# Patient Record
Sex: Female | Born: 1955 | Race: White | Hispanic: No | Marital: Married | Smoking: Former smoker
Health system: Southern US, Community
[De-identification: ages and names within clinical notes are randomized; demographics above are authoritative.]

## PROBLEM LIST (undated history)

## (undated) DIAGNOSIS — E063 Autoimmune thyroiditis: Secondary | ICD-10-CM

## (undated) DIAGNOSIS — K219 Gastro-esophageal reflux disease without esophagitis: Secondary | ICD-10-CM

## (undated) HISTORY — PX: ABDOMINAL HYSTERECTOMY: SHX81

---

## 2017-11-11 DIAGNOSIS — F4320 Adjustment disorder, unspecified: Secondary | ICD-10-CM | POA: Diagnosis not present

## 2017-12-23 DIAGNOSIS — Z0000 Encounter for general adult medical examination without abnormal findings: Secondary | ICD-10-CM | POA: Diagnosis not present

## 2017-12-28 DIAGNOSIS — Z0000 Encounter for general adult medical examination without abnormal findings: Secondary | ICD-10-CM | POA: Diagnosis not present

## 2018-03-17 DIAGNOSIS — F4320 Adjustment disorder, unspecified: Secondary | ICD-10-CM | POA: Diagnosis not present

## 2018-06-13 DIAGNOSIS — F4320 Adjustment disorder, unspecified: Secondary | ICD-10-CM | POA: Diagnosis not present

## 2018-07-28 DIAGNOSIS — Z6837 Body mass index (BMI) 37.0-37.9, adult: Secondary | ICD-10-CM | POA: Diagnosis not present

## 2018-07-28 DIAGNOSIS — E063 Autoimmune thyroiditis: Secondary | ICD-10-CM | POA: Diagnosis not present

## 2018-07-28 DIAGNOSIS — Z1389 Encounter for screening for other disorder: Secondary | ICD-10-CM | POA: Diagnosis not present

## 2018-12-07 DIAGNOSIS — E063 Autoimmune thyroiditis: Secondary | ICD-10-CM | POA: Diagnosis not present

## 2019-03-08 DIAGNOSIS — Z0000 Encounter for general adult medical examination without abnormal findings: Secondary | ICD-10-CM | POA: Diagnosis not present

## 2019-03-08 DIAGNOSIS — Z6834 Body mass index (BMI) 34.0-34.9, adult: Secondary | ICD-10-CM | POA: Diagnosis not present

## 2019-03-08 DIAGNOSIS — Z23 Encounter for immunization: Secondary | ICD-10-CM | POA: Diagnosis not present

## 2019-03-08 DIAGNOSIS — E063 Autoimmune thyroiditis: Secondary | ICD-10-CM | POA: Diagnosis not present

## 2019-03-08 DIAGNOSIS — Z1389 Encounter for screening for other disorder: Secondary | ICD-10-CM | POA: Diagnosis not present

## 2019-03-08 DIAGNOSIS — E6609 Other obesity due to excess calories: Secondary | ICD-10-CM | POA: Diagnosis not present

## 2019-04-10 DIAGNOSIS — E063 Autoimmune thyroiditis: Secondary | ICD-10-CM | POA: Diagnosis not present

## 2019-08-09 DIAGNOSIS — F4323 Adjustment disorder with mixed anxiety and depressed mood: Secondary | ICD-10-CM | POA: Diagnosis not present

## 2019-08-14 DIAGNOSIS — F4323 Adjustment disorder with mixed anxiety and depressed mood: Secondary | ICD-10-CM | POA: Diagnosis not present

## 2019-08-28 DIAGNOSIS — F4323 Adjustment disorder with mixed anxiety and depressed mood: Secondary | ICD-10-CM | POA: Diagnosis not present

## 2019-09-03 DIAGNOSIS — E6609 Other obesity due to excess calories: Secondary | ICD-10-CM | POA: Diagnosis not present

## 2019-09-03 DIAGNOSIS — Z6834 Body mass index (BMI) 34.0-34.9, adult: Secondary | ICD-10-CM | POA: Diagnosis not present

## 2019-09-03 DIAGNOSIS — E039 Hypothyroidism, unspecified: Secondary | ICD-10-CM | POA: Diagnosis not present

## 2019-09-04 DIAGNOSIS — F4323 Adjustment disorder with mixed anxiety and depressed mood: Secondary | ICD-10-CM | POA: Diagnosis not present

## 2019-09-25 DIAGNOSIS — F4323 Adjustment disorder with mixed anxiety and depressed mood: Secondary | ICD-10-CM | POA: Diagnosis not present

## 2019-11-19 DIAGNOSIS — Z1231 Encounter for screening mammogram for malignant neoplasm of breast: Secondary | ICD-10-CM | POA: Diagnosis not present

## 2021-02-26 ENCOUNTER — Ambulatory Visit
Admission: EM | Admit: 2021-02-26 | Discharge: 2021-02-26 | Disposition: A | Discharge status: Home / Self Care | Payer: 59 | Attending: Emergency Medicine | Admitting: Emergency Medicine

## 2021-02-26 ENCOUNTER — Encounter: Payer: Self-pay | Admitting: Emergency Medicine

## 2021-02-26 DIAGNOSIS — S61211A Laceration without foreign body of left index finger without damage to nail, initial encounter: Secondary | ICD-10-CM

## 2021-02-26 DIAGNOSIS — Z23 Encounter for immunization: Secondary | ICD-10-CM

## 2021-02-26 HISTORY — DX: Gastro-esophageal reflux disease without esophagitis: K21.9

## 2021-02-26 HISTORY — DX: Autoimmune thyroiditis: E06.3

## 2021-02-26 MED ORDER — TETANUS-DIPHTH-ACELL PERTUSSIS 5-2.5-18.5 LF-MCG/0.5 IM SUSY
0.5000 mL | PREFILLED_SYRINGE | Freq: Once | INTRAMUSCULAR | Status: AC
  Administered 2021-02-26: 0.5 mL via INTRAMUSCULAR

## 2021-02-26 NOTE — Discharge Instructions (Addendum)
Tetanus updated Bandage applied Keep covered for next and dry for next 24-48 hours.  After then you may gently clean with warm water and mild soap.  Avoid submerging wound in water. Change dressing daily and apply a thin layer of neosporin.  Return in 10 days to have sutures removed.   Take OTC ibuprofen or tylenol as needed for pain relief Return sooner or go to the ED if you have any new or worsening symptoms such as increased pain, redness, swelling, drainage, discharge, decreased range of motion of extremity, etc..

## 2021-02-26 NOTE — ED Provider Notes (Signed)
[facility]   [id number] 02/26/21 Arrival Time: 0839  CC: LACERATION  SUBJECTIVE:  Kathleen Bray is a 65 y.o. female who presents with a laceration to LT index finger that occurred this morning.  Symptoms began after cutting chicken with serrated knife.  Bleeding controlled.  Currently not on blood thinners.  Denies similar symptoms in the past.  Denies fever, chills, nausea, vomiting, redness, swelling, purulent drainage, decrease strength or sensation.   Td UTD: No.  ROS: As per HPI.  All other pertinent ROS negative.     Past Medical History:  Diagnosis Date   GERD (gastroesophageal reflux disease)    Hashimoto's disease    Past Surgical History:  Procedure Laterality Date   ABDOMINAL HYSTERECTOMY     No Known Allergies No current facility-administered medications on file prior to encounter.   No current outpatient medications on file prior to encounter.   Social History   Socioeconomic History   Marital status: Single    Spouse name: Not on file   Number of children: Not on file   Years of education: Not on file   Highest education level: Not on file  Occupational History   Not on file  Tobacco Use   Smoking status: Former    Pack years: 0.00    Types: Cigarettes    Passive exposure: Never   Smokeless tobacco: Never  Substance and Sexual Activity   Alcohol use: Yes    Alcohol/week: 1.0 standard drink    Types: 1 Glasses of wine per week    Comment: evenings   Drug use: Never   Sexual activity: Never  Other Topics Concern   Not on file  Social History Narrative   Not on file   Social Determinants of Health   Financial Resource Strain: Not on file  Food Insecurity: Not on file  Transportation Needs: Not on file  Physical Activity: Not on file  Stress: Not on file  Social Connections: Not on file  Intimate Partner Violence: Not on file   No family history on file.   OBJECTIVE:  Vitals:   02/26/21 0845 02/26/21 0846  BP: (!) 143/85    Pulse: 79   Resp: 19   Temp: 99 F (37.2 C)   TempSrc: Oral   SpO2: 94%   Weight:  218 lb (98.9 kg)  Height:  5\' 6"  (1.676 m)     General appearance: alert; no distress CV: radial pulse 2+ Finger: Strength and sensation intact Skin: laceration of second digit over PIP joint palmar aspect; size: approx 1 cm Psychological: alert and cooperative; normal mood and affect   Procedure: Verbal consent obtained. Patient provided with risks and alternatives to the procedure. Wound copiously irrigated with NS then cleansed with betadine. Anesthetized with 3 mL of lidocaine without epinephrine Wound carefully explored. No foreign body, tendon injury, or nonviable tissue were noted. Using sterile technique 1 horizontal 5-0 Prolene sutures were placed to reapproximate the wound. Patient tolerated procedure well. No complications. Minimal bleeding. Patient advised to look for and return for any signs of infection such as redness, swelling, discharge, or worsening pain. Return for suture removal in 10-12 days.  ASSESSMENT & PLAN:  1. Laceration of left index finger without foreign body without damage to nail, initial encounter     Meds ordered this encounter  Medications   Tdap (BOOSTRIX) injection 0.5 mL   Tetanus updated Bandage applied Keep covered for next and dry for next 24-48 hours.  After then you may gently  clean with warm water and mild soap.  Avoid submerging wound in water. Change dressing daily and apply a thin layer of neosporin.  Return in 10 days to have sutures removed.   Take OTC ibuprofen or tylenol as needed for pain relief Return sooner or go to the ED if you have any new or worsening symptoms such as increased pain, redness, swelling, drainage, discharge, decreased range of motion of extremity, etc..     Reviewed expectations re: course of current medical issues. Questions answered. Outlined signs and symptoms indicating need for more acute intervention. Patient  verbalized understanding. After Visit Summary given.    Alvino Chapel [place], PA-C 02/26/21 [id number]

## 2021-02-26 NOTE — ED Triage Notes (Signed)
Laceration to LT index finger after cutting chicken today

## 2021-09-20 NOTE — Progress Notes (Signed)
Cardiology Office Note:   Date:  09/21/2021  NAME:  Kathleen Bray    MRN: [id number] DOB:  [birth year]   PCP:  Assunta Found, MD  Cardiologist:  None  Electrophysiologist:  None   Referring MD: Assunta Found, MD   Chief Complaint  Patient presents with   Chest Pain   History of Present Illness:   Kathleen Bray is a 66 y.o. female with a hx of GERD who is being seen today for the evaluation of chest pain at the request of Assunta Found, MD. she reports for the last 3 months has had intermittent episodes of chest pain.  She describes heaviness and tightness in her chest.  It can occur at any time.  No identifiable trigger.  No alleviating factors.  She does report stress can bring it on and sometimes it does not.  She reports no exertional component to it.  Is not alleviated by rest.  She describes no associated shortness of breath.  She does describe some heartbeat sensation with it.  Describes rapid heartbeat.  Symptoms occur daily.  She is alarmed by them.  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  She reports still having symptoms.  There is no strong family history of heart disease.  She has never had a heart attack or stroke.  She is not diabetic.  Most recent labs show a total cholesterol 250, HDL 64, LDL 173, triglycerides 75.  She is on Crestor 5 mg daily.  Her blood pressure is well controlled today.  She is not on any medications.  She is obese with a BMI of 37.  She reports she has 2 children.  She is married.  Overall without major complaints in the office today.  EKG is normal.  She is a former smoker.  She does not drink alcohol in excess.  No drug use is reported.  Past Medical History: Past Medical History:  Diagnosis Date   GERD (gastroesophageal reflux disease)    Hashimoto's disease     Past Surgical History: Past Surgical History:  Procedure Laterality Date   ABDOMINAL HYSTERECTOMY      Current  Medications: Current Meds  Medication Sig   B Complex-C (SUPER B COMPLEX PO) Take 1 tablet by mouth daily.   buPROPion (WELLBUTRIN XL) 150 MG 24 hr tablet Take 150 mg by mouth every morning.   Cholecalciferol (VITAMIN D3) 125 MCG (5000 UT) TABS Take 1 tablet by mouth daily.   levothyroxine (SYNTHROID) 50 MCG tablet Take 50 mcg by mouth daily.   metoprolol tartrate (LOPRESSOR) 50 MG tablet Take 50 mg 2 hours before CT   Multiple Vitamin (MULTIVITAMIN) tablet Take 1 tablet by mouth daily.   NP THYROID 30 MG tablet Take 30 mg by mouth daily.   omeprazole (PRILOSEC) 20 MG capsule Take 20 mg by mouth daily.   rosuvastatin (CRESTOR) 5 MG tablet Take 5 mg by mouth daily.   VAGIFEM 10 MCG TABS vaginal tablet Place 1 tablet vaginally 2 (two) times a week.     Allergies:    Patient has no known allergies.   Social History: Social History   Socioeconomic History   Marital status: Married    Spouse name: Not on file   Number of children: 2   Years of education: Not on file   Highest education level: Not on file  Occupational History   Not on file  Tobacco Use   Smoking  status: Former    Packs/day: 1.00    Years: 10.00    Pack years: 10.00    Types: Cigarettes    Passive exposure: Never   Smokeless tobacco: Never  Substance and Sexual Activity   Alcohol use: Yes    Alcohol/week: 1.0 standard drink    Types: 1 Glasses of wine per week    Comment: evenings   Drug use: Never   Sexual activity: Never  Other Topics Concern   Not on file  Social History Narrative   Not on file   Social Determinants of Health   Financial Resource Strain: Not on file  Food Insecurity: Not on file  Transportation Needs: Not on file  Physical Activity: Not on file  Stress: Not on file  Social Connections: Not on file     Family History: The patient's family history is not on file.  ROS:   All other ROS reviewed and negative. Pertinent positives noted in the HPI.     EKGs/Labs/Other Studies  Reviewed:   The following studies were personally reviewed by me today:  EKG:  EKG is ordered today.  The ekg ordered today demonstrates normal sinus rhythm heart rate 68, no acute ischemic changes or evidence of infarction, and was personally reviewed by me.   Recent Labs: No results found for requested labs within last 8760 hours.   Recent Lipid Panel No results found for: CHOL, TRIG, HDL, CHOLHDL, VLDL, LDLCALC, LDLDIRECT  Physical Exam:   VS:  BP 116/78    Pulse 73    Ht 5\' 6"  (1.676 m)    Wt 227 lb 12.8 oz (103.3 kg)    SpO2 97%    BMI 36.77 kg/m    Wt Readings from Last 3 Encounters:  09/21/21 227 lb 12.8 oz (103.3 kg)  02/26/21 218 lb (98.9 kg)    General: Well nourished, well developed, in no acute distress Head: Atraumatic, normal size  Eyes: PEERLA, EOMI  Neck: Supple, no JVD Endocrine: No thryomegaly Cardiac: Normal S1, S2; RRR; no murmurs, rubs, or gallops Lungs: Clear to auscultation bilaterally, no wheezing, rhonchi or rales  Abd: Soft, nontender, no hepatomegaly  Ext: No edema, pulses 2+ Musculoskeletal: No deformities, BUE and BLE strength normal and equal Skin: Warm and dry, no rashes   Neuro: Alert and oriented to person, place, time, and situation, CNII-XII grossly intact, no focal deficits  Psych: Normal mood and affect   ASSESSMENT:   Kathleen Bray is a 66 y.o. female who presents for the following: 1. Precordial pain     PLAN:   1. Precordial pain -She presents with chest pain episodes that have occurred for the past 3 to 4 months.  Possibly cardiac.  CVD risk factors include obesity and hyperlipidemia.  There is no strong family history.  EKG in office demonstrates normal sinus rhythm with no acute ischemic changes.  I have recommended a coronary CTA for further evaluation.  She will give 66 a BMP today as well as take 50 mg of metoprolol tartrate 2 hours before the scan.  We will also proceed with an echocardiogram.  We will hold on the monitor.  Her  symptoms of chest tightness seem to be the bigger issue.  She describes rapid heartbeat after the chest pain symptoms.  If her CTA is negative and echo is normal we will pursue a monitor.  Disposition: Return if symptoms worsen or fail to improve.  Medication Adjustments/Labs and Tests Ordered: Current medicines are reviewed at length with  the patient today.  Concerns regarding medicines are outlined above.  Orders Placed This Encounter  Procedures   CT CORONARY MORPH W/CTA COR W/SCORE W/CA W/CM &/OR WO/CM   Basic metabolic panel   EKG 12-Lead   ECHOCARDIOGRAM COMPLETE   Meds ordered this encounter  Medications   metoprolol tartrate (LOPRESSOR) 50 MG tablet    Sig: Take 50 mg 2 hours before CT    Dispense:  1 tablet    Refill:  0    Patient Instructions  Medication Instructions:  Take Lopressor 50 mg 2 hours before cardiac ct    Labwork: BMET today  Testing/Procedures: Your physician has requested that you have an echocardiogram. Echocardiography is a painless test that uses sound waves to create images of your heart. It provides your doctor with information about the size and shape of your heart and how well your hearts chambers and valves are working. This procedure takes approximately one hour. There are no restrictions for this procedure.   Your physician has requested that you have cardiac CT. Cardiac computed tomography (CT) is a painless test that uses an x-ray machine to take clear, detailed pictures of your heart. For further information please visit [email or web address]. Please follow instruction sheet as given.    Follow-Up: As needed  Any Other Special Instructions Will Be Listed Below (If Applicable).  If you need a refill on your cardiac medications before your next appointment, please call your pharmacy.     Signed, Lenna GilfordWesley T. Flora Lipps'Neal, MD, [facility] Ltd[facility]ACC [place]   [facility]  [address] [place], [place] [address] [phone]  09/21/2021  4:08 PM

## 2021-09-21 ENCOUNTER — Encounter: Payer: Self-pay | Admitting: Cardiovascular Disease

## 2021-09-21 ENCOUNTER — Other Ambulatory Visit: Payer: Self-pay

## 2021-09-21 ENCOUNTER — Ambulatory Visit (INDEPENDENT_AMBULATORY_CARE_PROVIDER_SITE_OTHER): Payer: 59 | Admitting: Cardiovascular Disease

## 2021-09-21 ENCOUNTER — Other Ambulatory Visit (HOSPITAL_COMMUNITY)
Admission: RE | Admit: 2021-09-21 | Discharge: 2021-09-21 | Disposition: A | Discharge status: Home / Self Care | Payer: 59 | Source: Ambulatory Visit | Attending: Cardiovascular Disease | Admitting: Cardiovascular Disease

## 2021-09-21 VITALS — BP 116/78 | HR 73 | Ht 66.0 in | Wt 227.8 lb

## 2021-09-21 DIAGNOSIS — R072 Precordial pain: Secondary | ICD-10-CM | POA: Insufficient documentation

## 2021-09-21 LAB — BASIC METABOLIC PANEL
Anion gap: 7 (ref 5–15)
BUN: 17 mg/dL (ref 8–23)
CO2: 26 mmol/L (ref 22–32)
Calcium: 9.8 mg/dL (ref 8.9–10.3)
Chloride: 103 mmol/L (ref 98–111)
Creatinine, Ser: 0.84 mg/dL (ref 0.44–1.00)
GFR, Estimated: >60 mL/min (ref >60)
Glucose, Bld: 93 mg/dL (ref 70–99)
Potassium: 4.5 mmol/L (ref 3.5–5.1)
Sodium: 136 mmol/L (ref 135–145)

## 2021-09-21 MED ORDER — METOPROLOL TARTRATE 50 MG PO TABS: ORAL_TABLET | ORAL | 0 refills | Status: AC

## 2021-09-21 NOTE — Patient Instructions (Signed)
Medication Instructions:  Take Lopressor 50 mg 2 hours before cardiac ct    Labwork: BMET today  Testing/Procedures: Your physician has requested that you have an echocardiogram. Echocardiography is a painless test that uses sound waves to create images of your heart. It provides your doctor with information about the size and shape of your heart and how well your hearts chambers and valves are working. This procedure takes approximately one hour. There are no restrictions for this procedure.   Your physician has requested that you have cardiac CT. Cardiac computed tomography (CT) is a painless test that uses an x-ray machine to take clear, detailed pictures of your heart. For further information please visit [email or web address].[email or web address]. Please follow instruction sheet as given.    Follow-Up: As needed  Any Other Special Instructions Will Be Listed Below (If Applicable).  If you need a refill on your cardiac medications before your next appointment, please call your pharmacy.

## 2021-09-29 ENCOUNTER — Telehealth (HOSPITAL_COMMUNITY): Payer: Self-pay | Admitting: *Deleted

## 2021-09-29 NOTE — Telephone Encounter (Signed)
Reaching out to patient to offer assistance regarding upcoming cardiac imaging study; pt verbalizes understanding of appt date/time, parking situation and where to check in, pre-test NPO status and medications ordered, and verified current allergies; name and call back number provided for further questions should they arise  Larey Brick RN Navigator Cardiac Imaging [facility] [phone] office [phone] cell  Patient to take 50mg  metoprolol tartrate two hours prior to her cardiac CT scan. She is aware to arrive at 3:30pm for her 4pm scan.

## 2021-09-30 ENCOUNTER — Ambulatory Visit (HOSPITAL_COMMUNITY)
Admission: RE | Admit: 2021-09-30 | Discharge: 2021-09-30 | Disposition: A | Discharge status: Home / Self Care | Payer: 59 | Source: Ambulatory Visit | Attending: Cardiovascular Disease | Admitting: Cardiovascular Disease

## 2021-09-30 ENCOUNTER — Other Ambulatory Visit: Payer: Self-pay

## 2021-09-30 ENCOUNTER — Encounter (HOSPITAL_COMMUNITY): Payer: Self-pay

## 2021-09-30 DIAGNOSIS — R072 Precordial pain: Secondary | ICD-10-CM | POA: Insufficient documentation

## 2021-09-30 MED ORDER — NITROGLYCERIN 0.4 MG SL SUBL
0.8000 mg | SUBLINGUAL_TABLET | Freq: Once | SUBLINGUAL | Status: AC
  Administered 2021-09-30: 0.8 mg via SUBLINGUAL

## 2021-09-30 MED ORDER — NITROGLYCERIN 0.4 MG SL SUBL: 0.8000 mg | SUBLINGUAL_TABLET | Freq: Once | SUBLINGUAL | Status: DC

## 2021-09-30 MED ORDER — IOHEXOL 350 MG/ML SOLN
95.0000 mL | Freq: Once | INTRAVENOUS | Status: AC | PRN
  Administered 2021-09-30: 95 mL via INTRAVENOUS

## 2021-09-30 MED ORDER — NITROGLYCERIN 0.4 MG SL SUBL
SUBLINGUAL_TABLET | SUBLINGUAL | Status: AC
  Filled 2021-09-30: qty 2

## 2021-10-02 ENCOUNTER — Other Ambulatory Visit: Payer: Self-pay

## 2021-10-02 ENCOUNTER — Ambulatory Visit (INDEPENDENT_AMBULATORY_CARE_PROVIDER_SITE_OTHER): Payer: 59

## 2021-10-02 DIAGNOSIS — R002 Palpitations: Secondary | ICD-10-CM

## 2021-10-02 MED ORDER — ROSUVASTATIN CALCIUM 20 MG PO TABS: 20.0000 mg | ORAL_TABLET | Freq: Every day | ORAL | 1 refills | Status: AC

## 2021-10-02 NOTE — Progress Notes (Unsigned)
Enrolled for [employer] to mail a ZIO XT long term holter monitor to the patients address on file.  

## 2021-10-05 DIAGNOSIS — R002 Palpitations: Secondary | ICD-10-CM

## 2021-10-15 ENCOUNTER — Ambulatory Visit (HOSPITAL_COMMUNITY)
Admission: RE | Admit: 2021-10-15 | Discharge: 2021-10-15 | Disposition: A | Discharge status: Home / Self Care | Payer: 59 | Source: Ambulatory Visit | Attending: Cardiovascular Disease | Admitting: Cardiovascular Disease

## 2021-10-15 DIAGNOSIS — R072 Precordial pain: Secondary | ICD-10-CM | POA: Insufficient documentation

## 2021-10-15 LAB — ECHOCARDIOGRAM COMPLETE
Area-P 1/2: 3.6 cm2
S' Lateral: 2.9 cm

## 2021-10-15 NOTE — Progress Notes (Signed)
*  PRELIMINARY RESULTS* Echocardiogram 2D Echocardiogram has been performed.  Stacey Drain 10/15/2021, 10:14 AM

## 2022-05-24 IMAGING — CT CT HEART MORP W/ CTA COR W/ SCORE W/ CA W/CM &/OR W/O CM
1 series · 12 of 14 positions shown, 15 images · non-contrast
Comparison: None

Addendum:
CLINICAL DATA: 65F with chest pain

EXAM:
Cardiac/Coronary CTA
TECHNIQUE: The patient was scanned on a [employer] Force scanner.

[Series 1936: coronaries · 12 of 14 slices shown, 15 images]
[im 2/14  vessel]
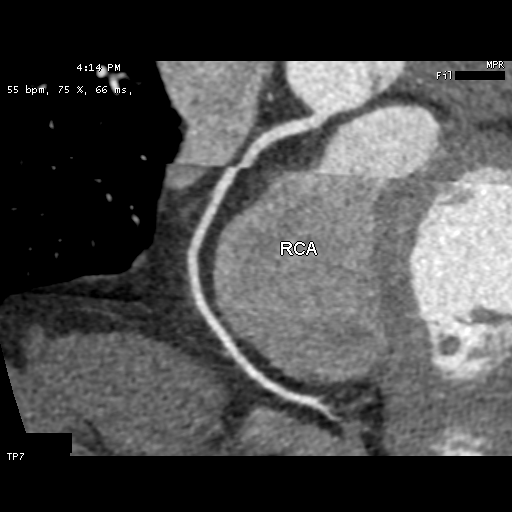
[im 2/14  lung]
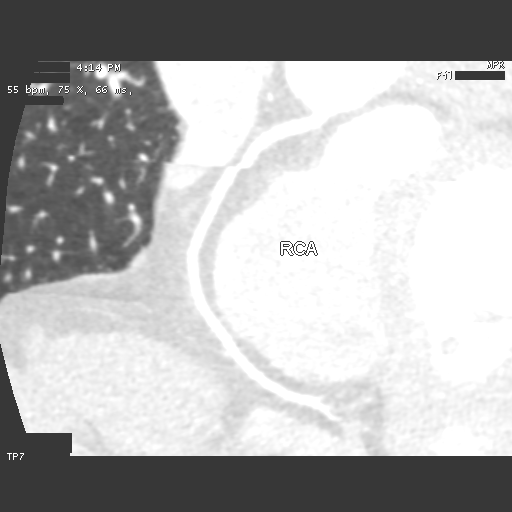
[im 3/14  vessel]
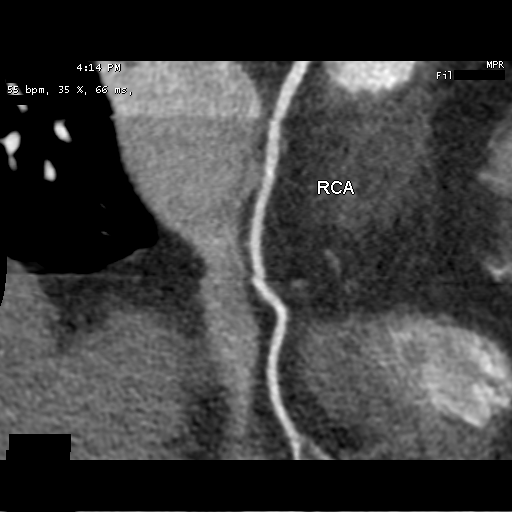
[im 4/14  vessel]
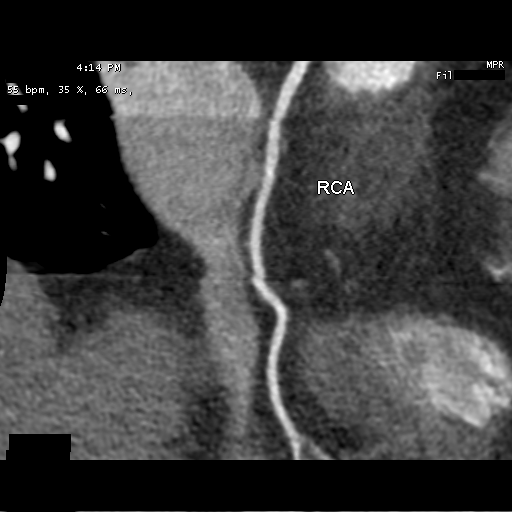
[im 5/14  vessel]
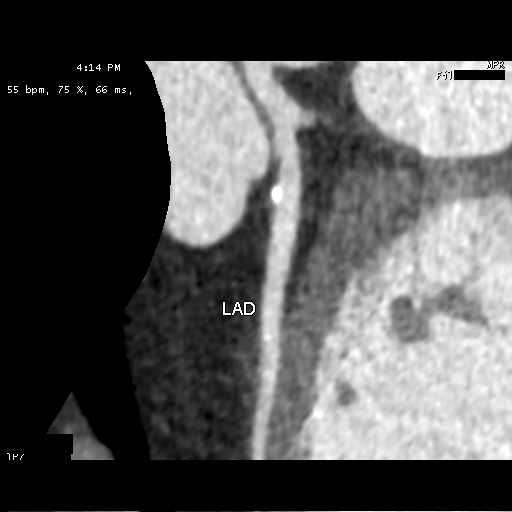
[im 6/14  vessel]
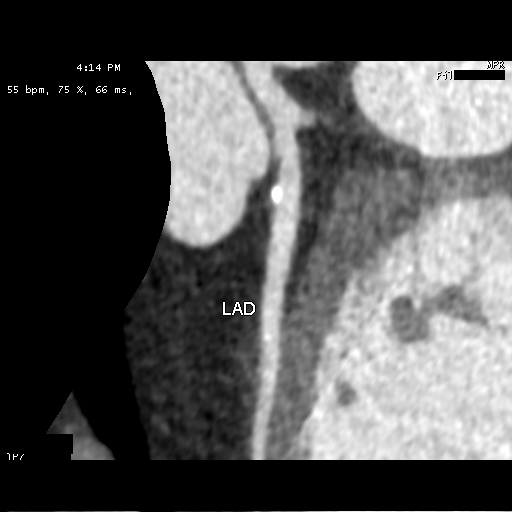
[im 6/14  lung]
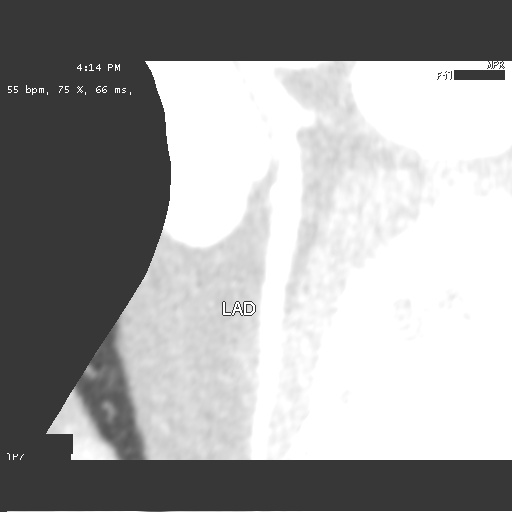
[im 7/14  vessel]
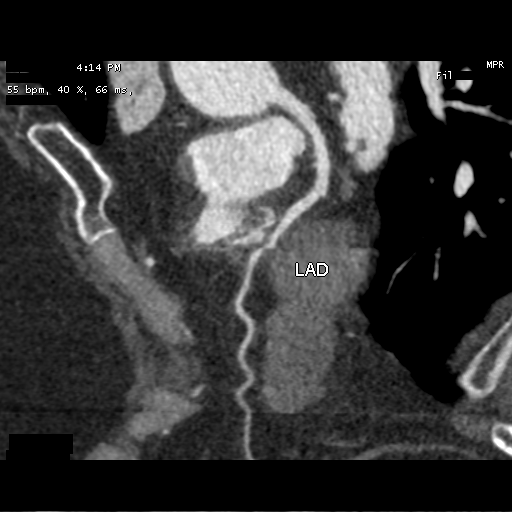
[im 8/14  vessel]
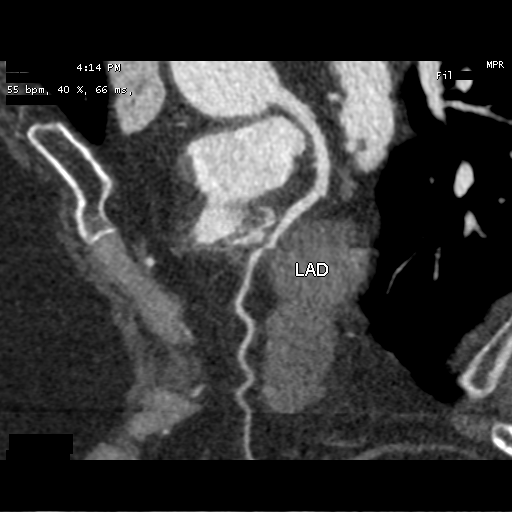
[im 9/14  vessel]
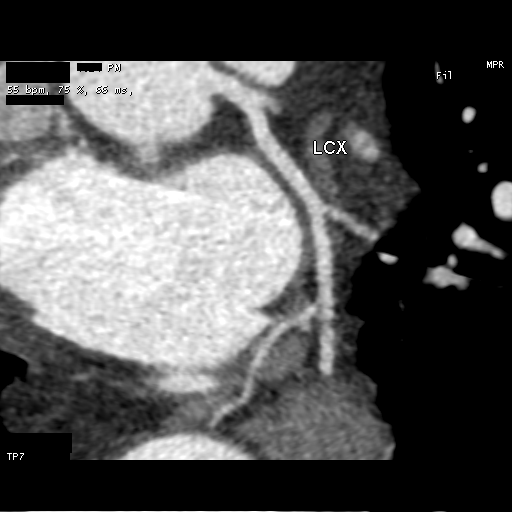
[im 10/14  vessel]
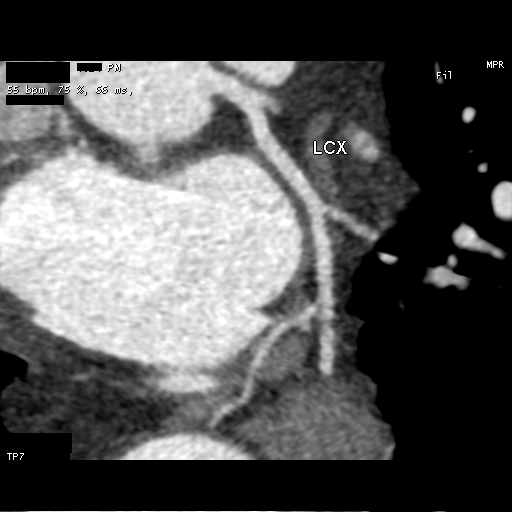
[im 10/14  lung]
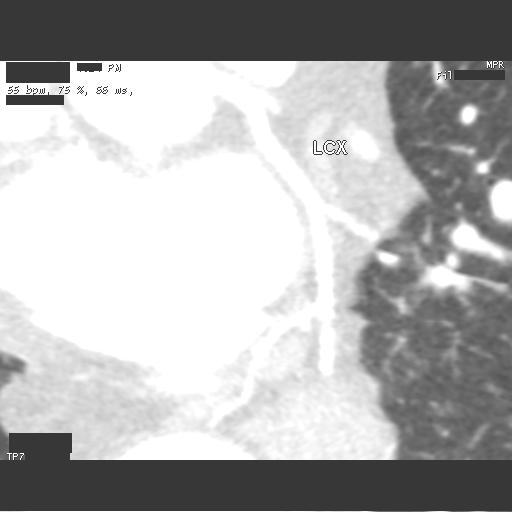
[im 11/14  vessel]
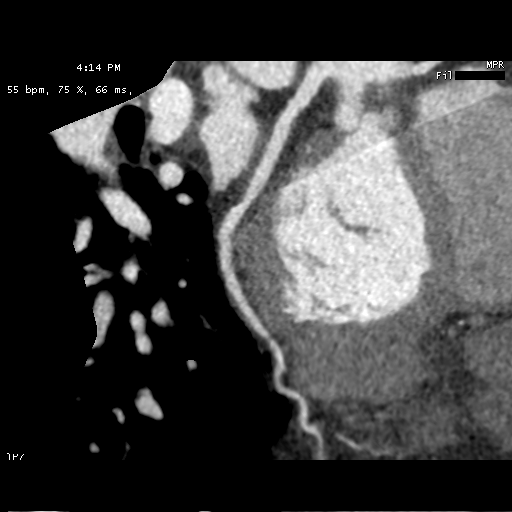
[im 12/14  vessel]
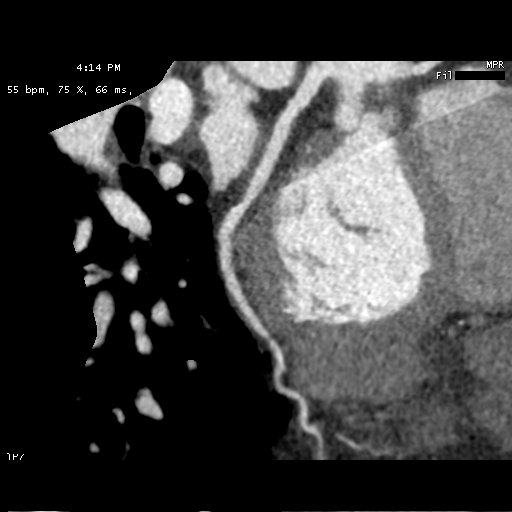
[im 13/14  vessel]
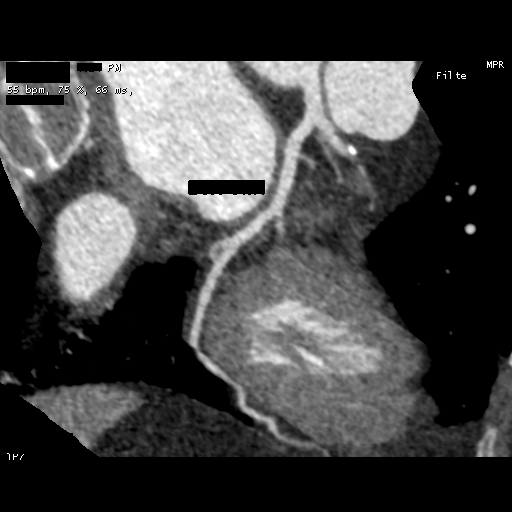

[12 of 14 positions shown; findings below may reference images not displayed]

FINDINGS: A 100 kV prospective scan was triggered in the descending thoracic
aorta at 111 HU's. Axial non-contrast 3 mm slices were carried out
through the heart. The data set was analyzed on a dedicated work
station and scored using the Agatson method. Gantry rotation speed
was 250 msecs and collimation was .6 mm. 0.8 mg of sl NTG was given.
The 3D data set was reconstructed in 5% intervals of the 35-75 % of
the R-R cycle. Phases were analyzed on a dedicated work station
using MPR, MIP and VRT modes. The patient received 80 cc of
contrast.

Coronary Arteries:  Normal coronary origin.  Codominance.

RCA is a codominant artery that gives rise to PDA. There is no
plaque.

Left main is a large artery that gives rise to LAD and LCX arteries.

LAD is a large vessel. There is calcified plaque in the proximal LAD
causing 0-24% stenosis

LCX is a codominant artery that gives rise to one large OM1 branch.
There is no plaque.

Other findings:

Left Ventricle: Normal size

Left Atrium: Mild enlargement

Pulmonary Veins: Normal configuration

Right Ventricle: Normal size

Right Atrium: Normal size

Cardiac valves: No calcifications

Thoracic aorta: Normal size

Pulmonary Arteries: Normal size

Systemic Veins: Normal drainage

Pericardium: Normal thickness
IMPRESSION: 1. Coronary calcium score of 34. This was 74th percentile for age
and sex matched control.

2.  Normal coronary origin with codominance.

3. Nonobstructive CAD with calcified plaque in proximal LAD causing
minimal (0-24%) stenosis

CAD-RADS 1. Minimal non-obstructive CAD (0-24%). Consider
non-atherosclerotic causes of chest pain. Consider preventive
therapy and risk factor modification.

ADDENDUM:
OVER-READ INTERPRETATION  CT CHEST

The following report is an over-read performed by radiologist Dr.
Amaiiranii [[email or web address]] on 06/18/2021. This over-read does
not include interpretation of cardiac or coronary anatomy or
pathology. The coronary CT angiography evaluation and coronary
calcium scoring interpretation by the cardiologist is attached.
Imaging of the chest is focused on cardiac structures and excludes
much of the chest on CT.
FINDINGS: Cardiovascular: See dedicated report for cardiovascular details.

Mediastinum/Nodes: Granulomatous changes in the mediastinum with
small calcified lymph nodes. No adenopathy in the visualized
portions of the chest. Esophagus grossly normal.

Lungs/Pleura: No effusion. No consolidative changes. Basilar
atelectasis.

Upper Abdomen: Incidental imaging of upper abdominal contents with
limited evaluation is unremarkable.

Musculoskeletal: No acute bone finding. No destructive bone process.
Spinal degenerative changes. Degenerative changes are mild.
IMPRESSION: 1. No acute or significant extracardiac findings.

*** End of Addendum ***
FINDINGS: A 100 kV prospective scan was triggered in the descending thoracic
aorta at 111 HU's. Axial non-contrast 3 mm slices were carried out
through the heart. The data set was analyzed on a dedicated work
station and scored using the Agatson method. Gantry rotation speed
was 250 msecs and collimation was .6 mm. 0.8 mg of sl NTG was given.
The 3D data set was reconstructed in 5% intervals of the 35-75 % of
the R-R cycle. Phases were analyzed on a dedicated work station
using MPR, MIP and VRT modes. The patient received 80 cc of
contrast.

Coronary Arteries:  Normal coronary origin.  Codominance.

RCA is a codominant artery that gives rise to PDA. There is no
plaque.

Left main is a large artery that gives rise to LAD and LCX arteries.

LAD is a large vessel. There is calcified plaque in the proximal LAD
causing 0-24% stenosis

LCX is a codominant artery that gives rise to one large OM1 branch.
There is no plaque.

Other findings:

Left Ventricle: Normal size

Left Atrium: Mild enlargement

Pulmonary Veins: Normal configuration

Right Ventricle: Normal size

Right Atrium: Normal size

Cardiac valves: No calcifications

Thoracic aorta: Normal size

Pulmonary Arteries: Normal size

Systemic Veins: Normal drainage

Pericardium: Normal thickness
IMPRESSION: 1. Coronary calcium score of 34. This was 74th percentile for age
and sex matched control.

2.  Normal coronary origin with codominance.

3. Nonobstructive CAD with calcified plaque in proximal LAD causing
minimal (0-24%) stenosis

CAD-RADS 1. Minimal non-obstructive CAD (0-24%). Consider
non-atherosclerotic causes of chest pain. Consider preventive
therapy and risk factor modification.

## 2022-07-19 DIAGNOSIS — E039 Hypothyroidism, unspecified: Secondary | ICD-10-CM | POA: Diagnosis not present

## 2022-07-19 DIAGNOSIS — E782 Mixed hyperlipidemia: Secondary | ICD-10-CM | POA: Diagnosis not present

## 2022-09-01 DIAGNOSIS — E039 Hypothyroidism, unspecified: Secondary | ICD-10-CM | POA: Diagnosis not present

## 2023-01-08 DIAGNOSIS — Z6839 Body mass index (BMI) 39.0-39.9, adult: Secondary | ICD-10-CM | POA: Diagnosis not present

## 2023-01-08 DIAGNOSIS — Z87891 Personal history of nicotine dependence: Secondary | ICD-10-CM | POA: Diagnosis not present

## 2023-01-08 DIAGNOSIS — K219 Gastro-esophageal reflux disease without esophagitis: Secondary | ICD-10-CM | POA: Diagnosis not present

## 2023-01-08 DIAGNOSIS — E785 Hyperlipidemia, unspecified: Secondary | ICD-10-CM | POA: Diagnosis not present

## 2023-01-08 DIAGNOSIS — F324 Major depressive disorder, single episode, in partial remission: Secondary | ICD-10-CM | POA: Diagnosis not present

## 2023-01-08 DIAGNOSIS — Z803 Family history of malignant neoplasm of breast: Secondary | ICD-10-CM | POA: Diagnosis not present

## 2023-01-08 DIAGNOSIS — R32 Unspecified urinary incontinence: Secondary | ICD-10-CM | POA: Diagnosis not present

## 2023-01-08 DIAGNOSIS — N951 Menopausal and female climacteric states: Secondary | ICD-10-CM | POA: Diagnosis not present

## 2023-01-08 DIAGNOSIS — E039 Hypothyroidism, unspecified: Secondary | ICD-10-CM | POA: Diagnosis not present

## 2023-03-02 DIAGNOSIS — H524 Presbyopia: Secondary | ICD-10-CM | POA: Diagnosis not present

## 2023-04-13 DIAGNOSIS — K080 Exfoliation of teeth due to systemic causes: Secondary | ICD-10-CM | POA: Diagnosis not present

## 2023-05-17 DIAGNOSIS — Z1231 Encounter for screening mammogram for malignant neoplasm of breast: Secondary | ICD-10-CM | POA: Diagnosis not present

## 2023-07-25 DIAGNOSIS — E039 Hypothyroidism, unspecified: Secondary | ICD-10-CM | POA: Diagnosis not present

## 2023-07-25 DIAGNOSIS — E782 Mixed hyperlipidemia: Secondary | ICD-10-CM | POA: Diagnosis not present

## 2023-07-25 DIAGNOSIS — J029 Acute pharyngitis, unspecified: Secondary | ICD-10-CM | POA: Diagnosis not present

## 2023-07-25 DIAGNOSIS — E6609 Other obesity due to excess calories: Secondary | ICD-10-CM | POA: Diagnosis not present

## 2023-07-25 DIAGNOSIS — Z0000 Encounter for general adult medical examination without abnormal findings: Secondary | ICD-10-CM | POA: Diagnosis not present

## 2023-07-25 DIAGNOSIS — Z6838 Body mass index (BMI) 38.0-38.9, adult: Secondary | ICD-10-CM | POA: Diagnosis not present

## 2023-07-25 DIAGNOSIS — Z1331 Encounter for screening for depression: Secondary | ICD-10-CM | POA: Diagnosis not present

## 2023-11-29 DIAGNOSIS — K080 Exfoliation of teeth due to systemic causes: Secondary | ICD-10-CM | POA: Diagnosis not present

## 2024-05-14 DIAGNOSIS — K080 Exfoliation of teeth due to systemic causes: Secondary | ICD-10-CM | POA: Diagnosis not present

## 2024-09-13 ENCOUNTER — Ambulatory Visit: Admitting: Family Medicine
